# Patient Record
Sex: Male | Born: 1935 | Race: White | Hispanic: No | Marital: Married
Health system: Southern US, Community
[De-identification: ages and names within clinical notes are randomized; demographics above are authoritative.]

---

## 2012-09-29 ENCOUNTER — Inpatient Hospital Stay: Payer: Self-pay | Admitting: Internal Medicine

## 2012-09-29 LAB — URINALYSIS, COMPLETE
Bacteria: NONE SEEN
Blood: NEGATIVE
Glucose,UR: NEGATIVE mg/dL (ref 0–75)
Leukocyte Esterase: NEGATIVE
Ph: 5 (ref 4.5–8.0)
Specific Gravity: 1.018 (ref 1.003–1.030)
Squamous Epithelial: NONE SEEN
WBC UR: 1 /HPF (ref 0–5)

## 2012-09-29 LAB — CK TOTAL AND CKMB (NOT AT ARMC): CK, Total: 68 U/L (ref 35–232)

## 2012-09-29 LAB — COMPREHENSIVE METABOLIC PANEL
Alkaline Phosphatase: 45 U/L — ABNORMAL LOW (ref 50–136)
Anion Gap: 7 (ref 7–16)
BUN: 16 mg/dL (ref 7–18)
Bilirubin,Total: 0.6 mg/dL (ref 0.2–1.0)
Calcium, Total: 8.6 mg/dL (ref 8.5–10.1)
Co2: 28 mmol/L (ref 21–32)
EGFR (African American): >60
EGFR (Non-African Amer.): >60
Glucose: 168 mg/dL — ABNORMAL HIGH (ref 65–99)
SGOT(AST): 33 U/L (ref 15–37)
SGPT (ALT): 39 U/L (ref 12–78)
Sodium: 137 mmol/L (ref 136–145)

## 2012-09-29 LAB — CBC
HCT: 40.1 % (ref 40.0–52.0)
HGB: 13.8 g/dL (ref 13.0–18.0)
MCHC: 34.4 g/dL (ref 32.0–36.0)
MCV: 97 fL (ref 80–100)
RDW: 14.8 % — ABNORMAL HIGH (ref 11.5–14.5)
WBC: 8 10*3/uL (ref 3.8–10.6)

## 2012-09-29 LAB — LIPASE, BLOOD: Lipase: 239 U/L (ref 73–393)

## 2012-09-29 LAB — TROPONIN I: Troponin-I: 0.12 ng/mL — ABNORMAL HIGH

## 2012-09-30 ENCOUNTER — Ambulatory Visit: Payer: Self-pay | Admitting: Neurology

## 2012-09-30 LAB — TROPONIN I: Troponin-I: 0.09 ng/mL — ABNORMAL HIGH

## 2012-09-30 LAB — TSH: Thyroid Stimulating Horm: 2.89 u[IU]/mL

## 2012-09-30 LAB — LIPID PANEL
Cholesterol: 130 mg/dL (ref 0–200)
HDL Cholesterol: 23 mg/dL — ABNORMAL LOW (ref 40–60)
Ldl Cholesterol, Calc: 43 mg/dL (ref 0–100)
Triglycerides: 321 mg/dL — ABNORMAL HIGH (ref 0–200)

## 2012-09-30 LAB — CBC WITH DIFFERENTIAL/PLATELET
Basophil %: 0.3 %
HGB: 12.7 g/dL — ABNORMAL LOW (ref 13.0–18.0)
Lymphocyte %: 16.2 %
MCH: 33.7 pg (ref 26.0–34.0)
MCHC: 34.8 g/dL (ref 32.0–36.0)
MCV: 97 fL (ref 80–100)
Monocyte #: 0.5 x10 3/mm (ref 0.2–1.0)
Monocyte %: 8.3 %
Neutrophil #: 4.7 10*3/uL (ref 1.4–6.5)
Neutrophil %: 73.9 %
Platelet: 138 10*3/uL — ABNORMAL LOW (ref 150–440)
RBC: 3.77 10*6/uL — ABNORMAL LOW (ref 4.40–5.90)
RDW: 14.7 % — ABNORMAL HIGH (ref 11.5–14.5)
WBC: 6.4 10*3/uL (ref 3.8–10.6)

## 2012-09-30 LAB — CK TOTAL AND CKMB (NOT AT ARMC)
CK, Total: 59 U/L (ref 35–232)
CK-MB: 1.3 ng/mL (ref 0.5–3.6)

## 2012-09-30 LAB — BASIC METABOLIC PANEL
Anion Gap: 6 — ABNORMAL LOW (ref 7–16)
BUN: 15 mg/dL (ref 7–18)
Chloride: 102 mmol/L (ref 98–107)
Co2: 30 mmol/L (ref 21–32)
Creatinine: 1.32 mg/dL — ABNORMAL HIGH (ref 0.60–1.30)
Glucose: 134 mg/dL — ABNORMAL HIGH (ref 65–99)
Osmolality: 278 (ref 275–301)
Sodium: 138 mmol/L (ref 136–145)

## 2012-09-30 LAB — HEMOGLOBIN A1C: Hemoglobin A1C: 6.1 % (ref 4.2–6.3)

## 2014-08-02 IMAGING — CR DG ELBOW 2V*L*
1 series · 2 of 2 positions shown · non-contrast
Comparison: none

REASON FOR EXAM: MRI clearance, possible metal in elbow
COMMENTS:

[Series 1: x elbow ap left · 0.14mm/px · 2 of 2 slices shown]
[im 1/2]
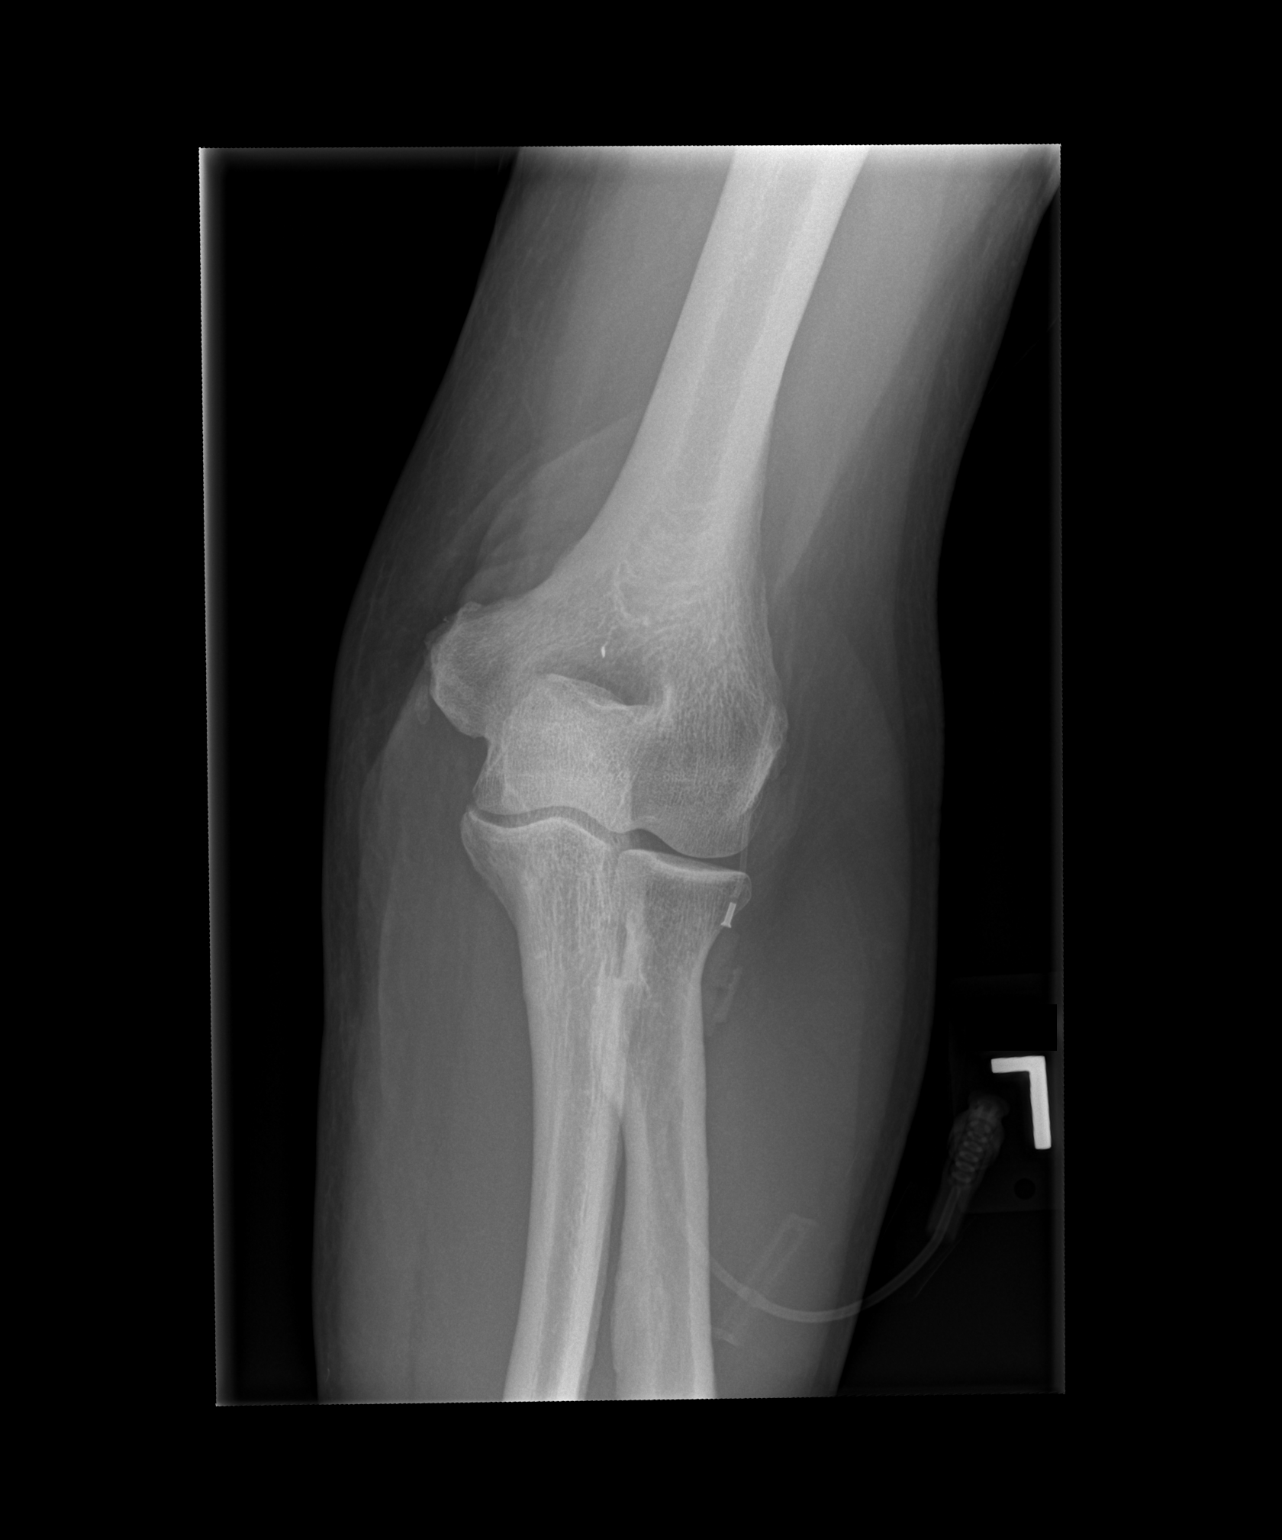
[im 2/2]
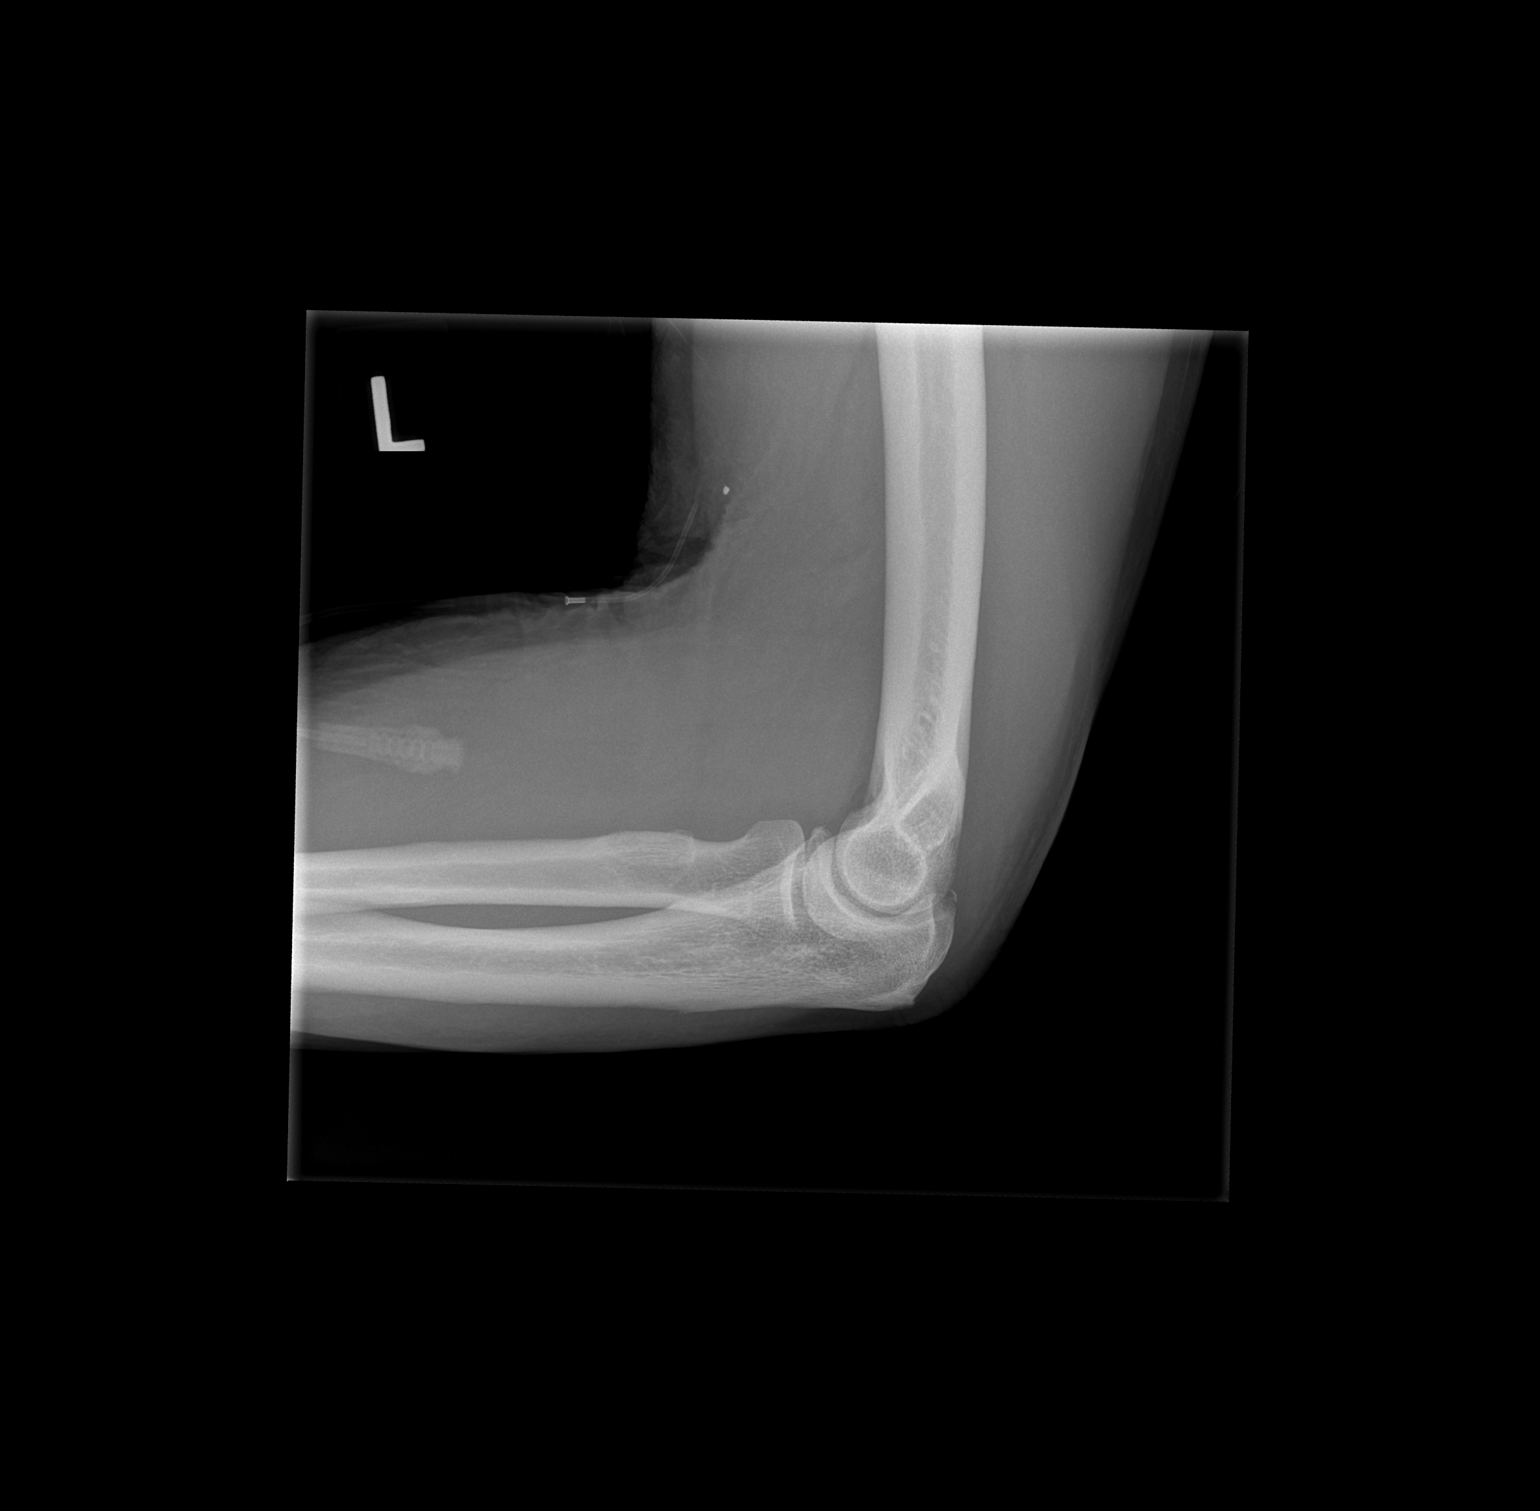

[2 of 2 positions shown; findings below may reference images not displayed]

PROCEDURE:     DXR - DXR ELBOW LEFT AP AND LATERAL  - September 30, 2012 [DATE]

RESULT:      Two views left elbow demonstrate normal alignment without
evidence of fracture. There is no bony destruction. If there is a concern
for occult fracture repeat complete 4 view series would be recommended in 7
to 10 days. There is a focal metallic opacity projecting on the frontal view
over the area the olecranon fossa. The lateral view shows this to be in the
anterior soft tissues of the antecubital region. An intravenous catheter is
present.
IMPRESSION: No acute bony abnormality. Small metallic density in the
anterior soft tissues of the distal humerus as described.

[REDACTED]

## 2014-08-25 NOTE — Consult Note (Signed)
PATIENT NAME:  Keith Little, Keith Little MR#:  [id number] DATE OF BIRTH:  13-Mar-1931  DATE OF CONSULTATION:  09/30/2012  REFERRING PHYSICIAN:   CONSULTING PHYSICIAN:  Pauletta BrownsYuriy Oceanna Arruda, MD  REASON FOR CONSULTATION:  Stroke.   HISTORY OF PRESENT ILLNESS: This is a pleasant 81 year old male visiting from [place] to visit his cousin admitted with stomach pains, nausea, vomiting multiple times. At that time, the patient also felt dizzy associated with diplopia and unsteady gait. Those symptoms have improved and the patient is back to baseline at this point. The patient is status post CAT scan of the head on admission that showed an old left cerebellar infarct. The patient is status post CTA of head with no acute vascular pathology found, status post MRI of the brain that showed a very small right frontal punctate infarct this is ischemic that was seen on diffusion imaging. Upon admission, the patient's blood pressure was 203/87, currently stating asymptomatic, and appears to be close to back to his baseline. Upon further questioning, the patient states he is not completely compliant with his medications and takes them seldomly.   PAST MEDICAL HISTORY: Significant for gout and hypertension.   ALLERGIES: No known drug allergies.  SOCIAL HISTORY: Retired. No smoking. No drinking. Lives with his daughter in [place].   PAST SURGICAL HISTORY: None.   FAMILY HISTORY: Mother had history of hypertension and stroke.   MEDICATIONS:  At home include hydralazine, metoprolol, allopurinol.  REVIEW OF SYSTEMS:  No shortness of breath. No chest pain. No abdominal pain. No diarrhea. No constipation. No lower extremity edema. No anxiety or depression.   IMAGING:  CAT scan had left chronic cerebellar infarct. MRI of the brain: Right frontal punctate infarct that is ischemic in nature. Carotid Dopplers:  Negative. Echocardiogram: Pending.   PHYSICAL EXAMINATION: VITAL SIGNS: Include a temperature of 98.1, pulse 61,  respirations 16, blood pressure 127/72 and pulse ox is 92% on room air.  NEUROLOGIC:  The patient is awake, alert and oriented x 3, able to tell me how many quarters in a dollar and how many quarters in a $1.75.  Speech appears to be fluent.  No signs of aphasia or dysarthria noted. Attention intact. Concentration intact. Cranial nerve examination: Visual fields intact bilaterally. Pupils 3 mm to 2 mm, reactive bilaterally. Facial sensation intact. Facial motor is intact. Tongue is midline. Uvula elevates symmetrically. Shoulder shrug is intact. Motor strength:  Appears to be 5 out of 5 bilateral upper and lower extremities. Sensation intact to light touch and temperature. Coordination: Finger-to-nose intact. Slight essential tremor is noted. Gait deferred   IMPRESSION: This is an [age] year old male visiting from [place] to visit his cousin with past medical history of hypertension, not well-controlled since the patient is not noncompliant with his medications, history of gout on allopurinol, presenting with nausea, vomiting and abdominal pain that has resolved. On imaging the patient was found to have incidental silent strokes in the left cerebellum and very small one in the right frontal most likely related to his poor blood pressure control. The patient is also found to have significant atrophy on his imaging.  Most of his work-up is complete such as negative CTA, negative Dopplers and he is pending echocardiogram.   PLAN: Continue the patient on aspirin.  Would put him on full-dose aspirin, 325, since the patient does have 2 strokes that are noticeable now. Continue statin. Continue his home medication. Encouragement to take his home blood pressure medications.  After echocardiogram is done, the patient is safe to  be discharged from a neurological standpoint. Can follow up with neurology as outpatient. Thank you. It was a pleasure seeing this patient.  ____________________________ Pauletta Browns,  MD yz:sb D: 09/30/2012 14:43:46 ET T: 09/30/2012 15:16:51 ET JOB#: [id number]  cc: Pauletta Browns, MD, <Dictator> Pauletta Browns MD ELECTRONICALLY SIGNED 10/01/2012 14:41

## 2014-08-25 NOTE — H&P (Signed)
PATIENT NAME:  Keith Little, Keith Little MR#:  [id number] DATE OF BIRTH:  Oct 29, 1930  DATE OF ADMISSION:  09/29/2012  PR[place] CARE [place]YSIC[place]   ER PHYSICIAN: Dr. Sharma Covert.    CHIEF COMPLAINT:  Dizziness, blurred vision, unstable gait.     HISTORY OF PRESENT ILLNESS: An 81 year old male visiting from [place] to see his cousin, suddenly developed stomach discomfort this morning, felt nauseous and vomited around 4 or 5 times. The  patient also felt very dizzy associated with the double vision and  unstable gait. The daughter mentioned that he was confused briefly. The patient found to have acute stroke on the CAT scan. The patient denies any weakness in hands and legs. No slurred speech. Has very minimal headache. Complains of double vision, unstable gait since this morning. Also felt nauseous and vomiting. No abdominal pain. No incontinence. No facial droop. The patient brought in by EMS. Blood pressure was 203/87 by EMS and blood sugar 170. The patient has no numbness or tingling, dizziness. Has room spinning, not related to any position,  associated with vomiting, double vision, and diaphoresis. Denies any chest pain. The patient has no diarrhea.   PAST MEDCICAL HISTORY:  Significant for gout and hypertension.   ALLERGIES: No known allergies.   SOCIAL HISTORY: No smoking. No drinking. Lives with his daughter [place], [place].   PAST SURGICAL HISTORY: None.   FAMILY HISTORY: Mother had hypertension and possibly stroke.   MEDICATIONS: The patient is on hydralazine 50 mg p.o. b.i.d., metoprolol 50 mg p.o. b.i.d., allopurinol 100 mg once a day.   REVIEW OF SYSTEMS:  CONSTITUTIONAL: Feels better at this time, but still has double vision.   EYES:  Double vision since this morning. No cataracts.   ENT: The patient does have chronic tinnitus, but no ear pain. No hearing loss. No epistaxis. No difficulty swallowing.   RESPIRATORY: No cough. No wheezing.   CARDIOVASCULAR: No chest  pain. No orthopnea. No PND, no pedal edema.   GASTROINTESTINAL: Does have nausea, vomiting since this morning, no diarrhea and the patient has some constipation and uses Metamucil on and off.   GENITOURINARY: No dysuria.   ENDOCRINE: No polyuria or nocturia.   HEMATOLOGIC: No anemia or easy bruising.   MUSCULOSKELETAL: No joint pain.   NEUROLOGIC: No numbness. No weakness. No dysarthria. No tremors. The patient had unstable gait this morning and no headache, had double vision and nausea and vomiting today.   PSYCHIATRIC: No anxiety or insomnia.   PHYSICAL EXAMINATION:   VITAL SIGNS:  Temperature 97.1, heart rate 63, blood pressure 153/79, sats 92% room air.   GENERAL: This is an elderly male answering questions appropriately, well-nourished, well-developed, not in distress.  Alert, awake, oriented. No obvious focal neurological deficit.   HEENT: Head atraumatic, normocephalic.   EYES: No conjunctival pallor. No scleral icterus. Visual acuity is normal. Extraocular movements are normal. Visual fields are normal. No hemianopia    NOSE: No turbinate hypertrophy. No drainage.  NECK: Supple. No JVD. Thyroid is in the midline.   RESPIRATORY: Clear to auscultation, good respiratory effort.   CARDIOVASCULAR: S1 and S2 regular. The patient has no murmurs. Good femoral and  pedal pulses.   ABDOMEN: Obese. Bowel sounds present. No organomegaly. No CVA tenderness.   MUSCULOSKELETAL: He has slightly swollen metacarpophalangeal joints on the right hand and slight warmth is present but no joint effusion at the knees or ankles.   SKIN: Well hydrated, no diaphoresis.    NEUROLOGIC:  Cranial nerves II through  XII are intact.  Deep tendon reflexes 2+ bilaterally. Motor strength 4/4 in upper and lower extremities. Sensation is intact.   PSYCHIATRIC: Mood and affect are within normal limits.   LABORATORY, DIAGNOSTIC AND RADIOLOGICAL DATA: Head CT shows atrophy and microvascular ischemic  changes. Possible, tiny left cerebellar infarct with also possible small posterior left cerebellar infarct.    Urinalysis is clear.   WBC 8, hemoglobin 13.8, hematocrit 40.1, platelets 151.   Electrolytes: Sodium 137, potassium 4.3, chloride 102, bicarbonate 28, BUN 16, creatinine 1 glucose 168, troponin 0.13.   EKG shows normal sinus and rhythm 63 beats per minute. No ST-T changes.   ASSESSMENT AND PLAN: An 81 year old male with acute cerebellar stroke. The patient is going to be admitted to telemetry. We will start the patient on aspirin and get the fasting lipase and get MRI of the brain, carotid ultrasound and echocardiogram and continue neurologic checks. Obtain neurology consult, but we do not have any neurologist today. Finish up the  stroke workup and if needed, will consider  teleneurology eval   The patient's other diagnoses include:  1.  Accelerated hypertension initially with slightly elevated troponins. The patient did not take morning medicines secondary to vomiting, so restart hydrallazine  and beta blockers   2.  For elevated troponins, the patient's EKG is normal. No chest pain. Started already on aspirin. He is on beta blockers. We will get the statins on board when the lipid levels will  be obtained tomorrow morning and continue to follow the  trend of troponins, follow the echocardiogram.  3.  Gout.  The patient has gout and he is on allopurinol 100 mg p.o. daily. He does have some  flare-up of the right hand. We could use some indomethacin and see how he does.     TIME SPENT: About 60 minutes.  Discussed the plan with the patient's daughter in detail.    ____________________________ Katha HammingSnehalatha Kassidie Hendriks, MD sk:cc D: 09/29/2012 15:36:00 ET T: 09/29/2012 16:35:05 ET JOB#: [id number]  cc: Katha HammingSnehalatha Joushua Dugar, MD, <Dictator> Katha HammingSNEHALATHA Mushka Laconte MD ELECTRONICALLY SIGNED 11/15/2012 19:03

## 2014-08-25 NOTE — Discharge Summary (Signed)
PATIENT NAME:  Keith Little, Keith Little MR#:  [id number] DATE OF BIRTH:  04-10-1931  DATE OF ADMISSION:  09/29/2012  DATE OF DISCHARGE:  10/01/2012  DISCHARGE DIAGNOSES: 1.  Acute cerebrovascular accident of the frontal area.  2.  Hypertensive emergency.  3.  Hypertriglyceridemia.  4.  Elevated troponin secondary to cerebrovascular accident.  5.  Noncompliance.   CONSULTANTS:  Dr. Loretha BrasilZeylikman of neurology.   IMAGING STUDIES DONE INCLUDE:  An MRI of the brain without contrast, which showed tiny acute punctate infarct in the right frontal lobe, chronic ischemic changes. Prior old left cerebellar infarct.   Echocardiogram showed ejection fraction greater than 75% with diastolic dysfunction. No embolic source found.   Ultrasound of the carotids showed no acute or significant stenosis.   ADMITTING HISTORY AND PHYSICAL AND HOSPITAL COURSE:  Please see detailed H and P dictated by Dr. Luberta MutterKonidena. An 81 year old male patient visiting from [place]  presented to the hospital complaining of dizziness, blurred vision, unstable gait, nausea and vomiting. The patient was found to have blood pressure of 203/87. Was admitted to the hospitalist service to rule out CVA and with hypertensive emergency.   HOSPITAL COURSE:  The patient had an MRI of the brain which showed a small frontal tiny infarct. This was thought to be incidental finding by Dr. Loretha BrasilZeylikman of neurology. His symptoms were thought to be secondary to uncontrolled blood pressure secondary to his noncompliance. The patient had been started on aspirin, statin, the increase in blood pressure medications and will be discharged home with outpatient physical therapy. His symptoms have resolved and he is close to baseline by the time of discharge.   On the day of discharge, his motor strength is 5/5 in upper and lower extremities on examination.   DISCHARGE MEDICATIONS: 1.  Metoprolol tartrate 50 mg oral 2 times a day.  2.  Allopurinol 300 mg oral once a day.   3.  Simvastatin 20 mg oral once a day.  4.  Aspirin 81 mg oral once a day.  5.  Fish oil 1 gram oral once a day.  6.  Hydralazine 50 mg oral 4 times a day.    FINAL DISCHARGE INSTRUCTIONS:  The patient will be on a low-sodium, low-fat diet. Activity as tolerated. Follow up with the primary care physician in 1 to 2 weeks in [place].   Time spent on day of discharge and discharge activity was 40 minutes.   ____________________________ Molinda BailiffSrikar R. Charley Miske, MD srs:nts D: 10/01/2012 16:47:14 ET T: 10/02/2012 07:15:59 ET JOB#: [id number]  cc: Wardell HeathSrikar R. Tanush Drees, MD, <Dictator> Orie FishermanSRIKAR R Axxel Gude MD ELECTRONICALLY SIGNED 10/26/2012 10:42

## 2019-02-16 NOTE — Progress Notes
DATE: 02/21/2019   NAME: Ronald Whitehead   MRN: [id number]   DOB: 04/02/1931   AGE: 85 y.o.       Donnelly Stager M.D.    The patient was seen in our [place] office.      REFERRING PHYSICIAN: Bebe Shaggy, MD    CHIEF COMPLAINT: Lower back pain.     HISTORY OF PRESENT ILLNESS:  The patient is a 85 y.o. male who presents to the clinic for orthopedic evaluation of {Blank single:19197::''her'',''his''} lumbar spine.    The patient reports low back pain rated at a /10 in severity.     ROS:  A COVID-19 questionnaire was filled out by the patient including contact with anybody diagnosed with COVID-19 in the last 30 days, fever, headaches, muscle pain, weakness, and diarrhea nausea vomiting abdominal pain, respiratory illness/cough, shortness of breath, loss of smell, loss of taste, rash skin irritation, unexplained hemorrhage, and fatigue.  Responses were negative.    PAST MEDICAL HISTORY: No past medical history on file.     PREVIOUS SURGERIES: No past surgical history on file.    SOCIAL HISTORY:   Social History     Tobacco Use   ? Smoking status: Not on file   Substance Use Topics   ? Alcohol use: Not on file   ? Drug use: Not on file       FAMILY HISTORY: family history is not on file.    ALLERGIES: Allergies not on file    MEDICATIONS: No current outpatient medications on file.    REVIEW OF SYSTEMS: The 14-point review of systems as documented today in the medical record is remarkable for the positive orthopedic problems discussed above and their relevance was considered with respect to Constitutional, ENT, Cardiovascular, GU, Skin, Neurologic, Endocrine, Hematologic, Psychiatric, Gastrointestinal, Respiratory, Eyes and Allergic/Immunologic systems.    PHYSICAL EXAMINATION:  There were no vitals taken for this visit.  GENERAL: Alert, oriented, in no acute distress     PSYCH: Normal affect and mood    EENT: External exam of the eyes, ears, nose and mouth reveals no deformities, scars or lesions PULMONARY: Respirations are regular and unlabored. Respiratory effort is normal with no evidence of intercostal retraction, or excessive use of accessory muscles    MUSCULOSKELETAL/LUMBAR: Standing upright posture is with shoulders and pelvis level.  Lumbar integumentary is warm, dry and pink without any erythema, ecchymosis or edema.  Forward flexion and lateral side bending is without reciprocation of pain. Toe and heal walking is performable without any weakness or reciprocation of leg or back pain.  No tenderness is elicited upon palpation.  Sitting straight leg raise is negative to bilateral lower extremities.  There are no motor or sensory deficits upon exam today. Trendelenburg test is {Blank single:19197::''positive'',''negative''}.  Gait upon entrance and exit of the exam room is normal without a limp and unassisted by any ambulatory aid.     RADIOGRAPHS: 2 view x-rays of the lumbar spine were ordered, performed, and reviewed and interpreted by myself from an orthopedic standpoint in office today and reviewed with the patient. They show     DIAGNOSIS:     RECOMMENDATIONS: An extensive amount of time is spent with the patient reviewing plain radiograph and MRI imaging pathology and how this correlates with their subjective complaints and objective findings.            All of patient's questions and concerns were addressed and answered.      FOLLOW UP: The patient will follow  up in ...      Sincerely,        _________________________   Laurey Morale, M.D.  ORTHOPEDIC SURGERY      CC: Bebe Shaggy, MD

## 2019-02-21 ENCOUNTER — Ambulatory Visit: Payer: PRIVATE HEALTH INSURANCE

## 2019-02-23 NOTE — Progress Notes
DATE: 02/24/2019   NAME: Ronald Whitehead   MRN: [id number]   DOB: [birth year]   AGE: 85 y.o.       Donnelly Stager M.D.    The patient was seen in our [place] office.      REFERRING PHYSICIAN: Bebe Shaggy, MD    CHIEF COMPLAINT: Lower back pain.     HISTORY OF PRESENT ILLNESS:  The patient is a 85 y.o. male who presents to the clinic for orthopedic evaluation of {Blank single:19197::''her'',''his''} lumbar spine.    The patient reports low back pain rated at a /10 in severity.     ROS:  A COVID-19 questionnaire was filled out by the patient including contact with anybody diagnosed with COVID-19 in the last 30 days, fever, headaches, muscle pain, weakness, and diarrhea nausea vomiting abdominal pain, respiratory illness/cough, shortness of breath, loss of smell, loss of taste, rash skin irritation, unexplained hemorrhage, and fatigue.  Responses were negative.    PAST MEDICAL HISTORY: No past medical history on file.     PREVIOUS SURGERIES: No past surgical history on file.    SOCIAL HISTORY:   Social History     Tobacco Use   ? Smoking status: Not on file   Substance Use Topics   ? Alcohol use: Not on file   ? Drug use: Not on file       FAMILY HISTORY: family history is not on file.    ALLERGIES: Allergies not on file    MEDICATIONS: No current outpatient medications on file.    REVIEW OF SYSTEMS: The 14-point review of systems as documented today in the medical record is remarkable for the positive orthopedic problems discussed above and their relevance was considered with respect to Constitutional, ENT, Cardiovascular, GU, Skin, Neurologic, Endocrine, Hematologic, Psychiatric, Gastrointestinal, Respiratory, Eyes and Allergic/Immunologic systems.    PHYSICAL EXAMINATION:  There were no vitals taken for this visit.  GENERAL: Alert, oriented, in no acute distress     PSYCH: Normal affect and mood    EENT: External exam of the eyes, ears, nose and mouth reveals no deformities, scars or lesions PULMONARY: Respirations are regular and unlabored. Respiratory effort is normal with no evidence of intercostal retraction, or excessive use of accessory muscles    MUSCULOSKELETAL/LUMBAR: Standing upright posture is with shoulders and pelvis level.  Lumbar integumentary is warm, dry and pink without any erythema, ecchymosis or edema.  Forward flexion and lateral side bending is without reciprocation of pain. Toe and heal walking is performable without any weakness or reciprocation of leg or back pain.  No tenderness is elicited upon palpation.  Sitting straight leg raise is negative to bilateral lower extremities.  There are no motor or sensory deficits upon exam today. Trendelenburg test is {Blank single:19197::''positive'',''negative''}.  Gait upon entrance and exit of the exam room is normal without a limp and unassisted by any ambulatory aid.     RADIOGRAPHS: 2 view x-rays of the lumbar spine were ordered, performed, and reviewed and interpreted by myself from an orthopedic standpoint in office today and reviewed with the patient. They show     DIAGNOSIS:     RECOMMENDATIONS: An extensive amount of time is spent with the patient reviewing plain radiograph and MRI imaging pathology and how this correlates with their subjective complaints and objective findings.            All of patient's questions and concerns were addressed and answered.      FOLLOW UP: The patient will follow  up in ...      Sincerely,        _________________________   Laurey Morale, M.D.  ORTHOPEDIC SURGERY      CC: Bebe Shaggy, MD

## 2019-02-24 ENCOUNTER — Ambulatory Visit: Payer: PRIVATE HEALTH INSURANCE

## 2019-02-24 DIAGNOSIS — R52 Pain, unspecified: Secondary | ICD-10-CM

## 2019-02-24 DIAGNOSIS — S32010A Wedge compression fracture of first lumbar vertebra, initial encounter for closed fracture: Secondary | ICD-10-CM

## 2019-03-16 DIAGNOSIS — S32010D Wedge compression fracture of first lumbar vertebra, subsequent encounter for fracture with routine healing: Secondary | ICD-10-CM

## 2019-03-16 NOTE — Progress Notes
DATE: 03/17/2019   NAME: Ronald Whitehead   MRN: [id number]   DOB: 1930/06/08   AGE: 85 y.o.       Laurey Morale, MD    The patient was seen at the [facility] office.    INTERIM HISTORY: [name] returns for a follow up visit of their lower back pain. He sustained a compression fracture of the L1 vertebra following a fall on February 03, 2019.     The patient returns today for a routine follow up with x-rays.     ROS:  A COVID-19 questionnaire was filled out by the patient including contact with anybody diagnosed with COVID-19 in the last 30 days, fever, headaches, muscle pain, weakness, and diarrhea nausea vomiting abdominal pain, respiratory illness/cough, shortness of breath, loss of smell, loss of taste, rash skin irritation, unexplained hemorrhage, and fatigue.  Responses were negative.      PHYSICAL EXAMINATION:    GENERAL: Alert, oriented, in no acute distress     PSYCH: Normal affect and mood    EENT: External exam of the eyes, ears, nose and mouth reveals no deformities, scars or lesions    PULMONARY: Respirations are regular and unlabored. Respiratory effort is normal with no evidence of intercostal retraction, or excessive use of accessory muscles    MUSCULOSKELETAL/LUMBAR: Standing upright posture is with shoulders and pelvis level. Lumbar integumentary is warm, dry and pink without any erythema, ecchymosis or edema. Range of motion: Forward flexion and lateral side bending is without reciprocation of pain. Toe and heal walking is performable without any weakness or reciprocation of leg or back pain.  No tenderness is elicited upon palpation.  Sitting straight leg raise is negative to bilateral lower extremities.  There are no motor or sensory deficits upon exam today. Trendelenburg test is negative.      Gait upon entrance and exit of the exam room is normal without a limp and unassisted by any ambulatory aid. RADIOGRAPHS: 2 view x-rays of the lumbar spine were ordered, performed, and reviewed and interpreted by myself from an orthopedic standpoint in office today and reviewed with the patient. They show        DIAGNOSIS:  1. Compression fracture of the L1 vertebra.     RECOMMENDATIONS AND PLAN: An extensive amount of time was taken discussing diagnosis and treatment options with the patient.       All of the patient?s questions and concerns were addressed and answered.      FOLLOWUP: The patient will follow up            Sincerely,                                 Laurey Morale, M.D.  ORTHOPEDIC SURGERY    Cc: Bebe Shaggy, MD

## 2019-03-17 ENCOUNTER — Ambulatory Visit: Payer: PRIVATE HEALTH INSURANCE

## 2019-05-24 DIAGNOSIS — S32010D Wedge compression fracture of first lumbar vertebra, subsequent encounter for fracture with routine healing: Secondary | ICD-10-CM

## 2019-05-24 NOTE — Progress Notes
DATE: 05/26/2019   NAME: Ronald Whitehead   MRN: [id number]   DOB: 02/22/1931   AGE: 86 y.o.       Laurey Morale, MD    The patient was seen at the [facility] office.    INTERIM HISTORY: Ronald Whitehead returns for a follow up visit of their lower back pain. He sustained a compression fracture of the L1 vertebra following a fall on February 03, 2019.    He presents to the office reporting     The patient has CLL. He is scheduled to follow up with his leukemia specialist today.     ROS:  A COVID-19 questionnaire was filled out by the patient including contact with anybody diagnosed with COVID-19 in the last 30 days, fever, headaches, muscle pain, weakness, and diarrhea nausea vomiting abdominal pain, respiratory illness/cough, shortness of breath, loss of smell, loss of taste, rash skin irritation, unexplained hemorrhage, and fatigue.  Responses were negative.      PHYSICAL EXAMINATION:    GENERAL: Alert, oriented, in no acute distress     PSYCH: Normal affect and mood    EENT: External exam of the eyes, ears, nose and mouth reveals no deformities, scars or lesions    PULMONARY: Respirations are regular and unlabored. Respiratory effort is normal with no evidence of intercostal retraction, or excessive use of accessory muscles    MUSCULOSKELETAL/LUMBAR: Standing upright posture is slightly leaning forward with shoulders and pelvis level.  Lumbar integumentary is warm, dry and pink without any erythema, ecchymosis or edema.  Forward flexion and lateral side bending is limited without reciprocation of pain. Toe and heal walking is performable without any weakness or reciprocation of leg or back pain.  No tenderness is elicited upon palpation.  Sitting straight leg raise is negative to bilateral lower extremities.  There are no motor or sensory deficits upon exam today. Trendelenburg test is negative. Gait upon entrance and exit of the exam room is leaning forward but is without a limp and unassisted by any ambulatory aid.     RADIOGRAPHS: No new radiographic studies were obtained today.        DIAGNOSIS:  1. Compression fracture of the L1 vertebra.     RECOMMENDATIONS AND PLAN: ***     FOLLOWUP: The patient will follow up           Sincerely,                                 Laurey Morale, M.D.  ORTHOPEDIC SURGERY    Cc: Bebe Shaggy, MD

## 2019-05-26 ENCOUNTER — Ambulatory Visit: Payer: PRIVATE HEALTH INSURANCE

## 2019-06-12 ENCOUNTER — Ambulatory Visit: Payer: PRIVATE HEALTH INSURANCE

## 2019-06-12 DIAGNOSIS — Z23 Encounter for immunization: Secondary | ICD-10-CM

## 2024-01-04 DEATH — deceased
